# Patient Record
Sex: Female | Born: 1935 | Race: White | Hispanic: No
Health system: Southern US, Community
[De-identification: ages and names within clinical notes are randomized; demographics above are authoritative.]

---

## 2004-03-01 ENCOUNTER — Ambulatory Visit: Payer: Self-pay | Admitting: Family Medicine

## 2004-03-18 ENCOUNTER — Ambulatory Visit: Payer: Self-pay | Admitting: Family Medicine

## 2004-04-18 ENCOUNTER — Ambulatory Visit: Payer: Self-pay | Admitting: Family Medicine

## 2005-09-29 ENCOUNTER — Other Ambulatory Visit: Payer: Self-pay

## 2005-09-29 ENCOUNTER — Ambulatory Visit: Payer: Self-pay | Admitting: Urology

## 2005-10-04 ENCOUNTER — Ambulatory Visit: Payer: Self-pay | Admitting: Urology

## 2007-09-11 ENCOUNTER — Ambulatory Visit: Payer: Self-pay | Admitting: Family Medicine

## 2008-01-04 ENCOUNTER — Ambulatory Visit: Payer: Self-pay | Admitting: Neurology

## 2008-09-12 ENCOUNTER — Ambulatory Visit: Payer: Self-pay | Admitting: Family Medicine

## 2010-12-30 ENCOUNTER — Ambulatory Visit: Payer: Self-pay | Admitting: Family Medicine

## 2011-05-27 ENCOUNTER — Ambulatory Visit: Payer: Self-pay | Admitting: Rheumatology

## 2011-10-19 ENCOUNTER — Ambulatory Visit: Payer: Self-pay | Admitting: Physical Medicine and Rehabilitation

## 2011-12-26 ENCOUNTER — Ambulatory Visit: Payer: Self-pay | Admitting: Urology

## 2012-01-31 ENCOUNTER — Ambulatory Visit: Payer: Self-pay | Admitting: Family Medicine

## 2012-03-22 ENCOUNTER — Emergency Department: Payer: Self-pay | Admitting: Emergency Medicine

## 2012-03-22 LAB — CK TOTAL AND CKMB (NOT AT ARMC)
CK, Total: 39 U/L (ref 21–215)
CK-MB: <0.5 ng/mL — ABNORMAL LOW (ref 0.5–3.6)

## 2012-03-22 LAB — COMPREHENSIVE METABOLIC PANEL
Albumin: 3.4 g/dL (ref 3.4–5.0)
Alkaline Phosphatase: 99 U/L (ref 50–136)
Anion Gap: 6 — ABNORMAL LOW (ref 7–16)
BUN: 21 mg/dL — ABNORMAL HIGH (ref 7–18)
Bilirubin,Total: 0.5 mg/dL (ref 0.2–1.0)
Calcium, Total: 8.8 mg/dL (ref 8.5–10.1)
EGFR (Non-African Amer.): 54 — ABNORMAL LOW
Glucose: 127 mg/dL — ABNORMAL HIGH (ref 65–99)
SGOT(AST): 25 U/L (ref 15–37)
Sodium: 134 mmol/L — ABNORMAL LOW (ref 136–145)

## 2012-03-22 LAB — CBC
HGB: 11.6 g/dL — ABNORMAL LOW (ref 12.0–16.0)
MCV: 91 fL (ref 80–100)
RBC: 3.87 10*6/uL (ref 3.80–5.20)
RDW: 15.9 % — ABNORMAL HIGH (ref 11.5–14.5)
WBC: 11.3 10*3/uL — ABNORMAL HIGH (ref 3.6–11.0)

## 2012-03-22 LAB — URINALYSIS, COMPLETE
Glucose,UR: NEGATIVE mg/dL (ref 0–75)
Hyaline Cast: 6
Nitrite: NEGATIVE
Protein: 30
Specific Gravity: 1.016 (ref 1.003–1.030)
WBC UR: 1351 /HPF (ref 0–5)

## 2012-03-22 LAB — TROPONIN I: Troponin-I: <0.02 ng/mL

## 2012-03-25 LAB — URINE CULTURE

## 2013-01-03 LAB — URINALYSIS, COMPLETE
Blood: NEGATIVE
Glucose,UR: NEGATIVE mg/dL (ref 0–75)
Ketone: NEGATIVE
Protein: 30
RBC,UR: 12 /HPF (ref 0–5)
Specific Gravity: 1.012 (ref 1.003–1.030)
Squamous Epithelial: 4

## 2013-01-03 LAB — CBC
HCT: 34 % — ABNORMAL LOW (ref 35.0–47.0)
HGB: 11.1 g/dL — ABNORMAL LOW (ref 12.0–16.0)
MCV: 84 fL (ref 80–100)
WBC: 13.7 10*3/uL — ABNORMAL HIGH (ref 3.6–11.0)

## 2013-01-03 LAB — COMPREHENSIVE METABOLIC PANEL
Albumin: 2.6 g/dL — ABNORMAL LOW (ref 3.4–5.0)
Anion Gap: 7 (ref 7–16)
BUN: 23 mg/dL — ABNORMAL HIGH (ref 7–18)
Bilirubin,Total: 0.3 mg/dL (ref 0.2–1.0)
Chloride: 103 mmol/L (ref 98–107)
Co2: 26 mmol/L (ref 21–32)
Creatinine: 0.93 mg/dL (ref 0.60–1.30)
EGFR (African American): >60
EGFR (Non-African Amer.): 56 — ABNORMAL LOW
Glucose: 135 mg/dL — ABNORMAL HIGH (ref 65–99)
Osmolality: 278 (ref 275–301)
Potassium: 3.6 mmol/L (ref 3.5–5.1)
SGOT(AST): 26 U/L (ref 15–37)
SGPT (ALT): 35 U/L (ref 12–78)
Sodium: 136 mmol/L (ref 136–145)

## 2013-01-03 LAB — LIPASE, BLOOD: Lipase: 100 U/L (ref 73–393)

## 2013-01-04 ENCOUNTER — Inpatient Hospital Stay: Payer: Self-pay | Admitting: Family Medicine

## 2013-01-04 LAB — CREATININE, SERUM: EGFR (Non-African Amer.): 56 — ABNORMAL LOW

## 2013-01-04 LAB — BUN: BUN: 13 mg/dL (ref 7–18)

## 2013-01-05 LAB — BASIC METABOLIC PANEL
Anion Gap: 7 (ref 7–16)
Chloride: 110 mmol/L — ABNORMAL HIGH (ref 98–107)
Co2: 24 mmol/L (ref 21–32)
Creatinine: 0.86 mg/dL (ref 0.60–1.30)
EGFR (African American): >60
EGFR (Non-African Amer.): >60
Glucose: 102 mg/dL — ABNORMAL HIGH (ref 65–99)
Osmolality: 281 (ref 275–301)
Sodium: 141 mmol/L (ref 136–145)

## 2013-01-05 LAB — LIPID PANEL
HDL Cholesterol: 30 mg/dL — ABNORMAL LOW (ref 40–60)
Ldl Cholesterol, Calc: 56 mg/dL (ref 0–100)
Triglycerides: 88 mg/dL (ref 0–200)
VLDL Cholesterol, Calc: 18 mg/dL (ref 5–40)

## 2013-01-05 LAB — CBC WITH DIFFERENTIAL/PLATELET
Basophil #: 0.2 10*3/uL — ABNORMAL HIGH (ref 0.0–0.1)
Basophil %: 1.8 %
HGB: 9.7 g/dL — ABNORMAL LOW (ref 12.0–16.0)
Lymphocyte #: 1.9 10*3/uL (ref 1.0–3.6)
Lymphocyte %: 21.4 %
MCH: 28.2 pg (ref 26.0–34.0)
MCV: 86 fL (ref 80–100)
Monocyte #: 0.1 x10 3/mm — ABNORMAL LOW (ref 0.2–0.9)
Monocyte %: 0.8 %
Neutrophil #: 6.7 10*3/uL — ABNORMAL HIGH (ref 1.4–6.5)
Platelet: 410 10*3/uL (ref 150–440)
RDW: 16.5 % — ABNORMAL HIGH (ref 11.5–14.5)
WBC: 8.9 10*3/uL (ref 3.6–11.0)

## 2013-01-05 LAB — TSH: Thyroid Stimulating Horm: 2.42 u[IU]/mL

## 2013-01-05 LAB — HEMOGLOBIN A1C: Hemoglobin A1C: 6 % (ref 4.2–6.3)

## 2013-01-06 LAB — CBC WITH DIFFERENTIAL/PLATELET
Basophil #: 0.2 10*3/uL — ABNORMAL HIGH (ref 0.0–0.1)
Eosinophil %: 1 %
HCT: 31.9 % — ABNORMAL LOW (ref 35.0–47.0)
Lymphocyte #: 1.7 10*3/uL (ref 1.0–3.6)
Lymphocyte %: 20.7 %
MCHC: 32.8 g/dL (ref 32.0–36.0)
MCV: 85 fL (ref 80–100)
Neutrophil #: 6.3 10*3/uL (ref 1.4–6.5)
Neutrophil %: 75.3 %
Platelet: 378 10*3/uL (ref 150–440)
RBC: 3.77 10*6/uL — ABNORMAL LOW (ref 3.80–5.20)
RDW: 16.9 % — ABNORMAL HIGH (ref 11.5–14.5)
WBC: 8.4 10*3/uL (ref 3.6–11.0)

## 2013-01-06 LAB — BASIC METABOLIC PANEL
Anion Gap: 7 (ref 7–16)
Calcium, Total: 8.6 mg/dL (ref 8.5–10.1)
Co2: 24 mmol/L (ref 21–32)
Creatinine: 0.86 mg/dL (ref 0.60–1.30)
EGFR (Non-African Amer.): >60
Glucose: 105 mg/dL — ABNORMAL HIGH (ref 65–99)
Potassium: 4 mmol/L (ref 3.5–5.1)
Sodium: 140 mmol/L (ref 136–145)

## 2013-01-07 LAB — BASIC METABOLIC PANEL
Anion Gap: 7 (ref 7–16)
BUN: 6 mg/dL — ABNORMAL LOW (ref 7–18)
Calcium, Total: 8.5 mg/dL (ref 8.5–10.1)
Co2: 24 mmol/L (ref 21–32)
Creatinine: 0.82 mg/dL (ref 0.60–1.30)
EGFR (African American): >60
EGFR (Non-African Amer.): >60
Glucose: 96 mg/dL (ref 65–99)
Osmolality: 275 (ref 275–301)
Potassium: 3.8 mmol/L (ref 3.5–5.1)
Sodium: 139 mmol/L (ref 136–145)

## 2013-01-08 LAB — CULTURE, BLOOD (SINGLE)

## 2013-01-13 ENCOUNTER — Emergency Department: Payer: Self-pay | Admitting: Emergency Medicine

## 2013-01-13 LAB — BASIC METABOLIC PANEL
Anion Gap: 5 — ABNORMAL LOW (ref 7–16)
Co2: 25 mmol/L (ref 21–32)
EGFR (African American): 54 — ABNORMAL LOW
Glucose: 129 mg/dL — ABNORMAL HIGH (ref 65–99)
Osmolality: 271 (ref 275–301)
Potassium: 4.5 mmol/L (ref 3.5–5.1)
Sodium: 133 mmol/L — ABNORMAL LOW (ref 136–145)

## 2013-01-13 LAB — CBC
HCT: 32.9 % — ABNORMAL LOW (ref 35.0–47.0)
Platelet: 620 10*3/uL — ABNORMAL HIGH (ref 150–440)
RBC: 3.91 10*6/uL (ref 3.80–5.20)
RDW: 16.7 % — ABNORMAL HIGH (ref 11.5–14.5)
WBC: 16.1 10*3/uL — ABNORMAL HIGH (ref 3.6–11.0)

## 2013-03-04 ENCOUNTER — Ambulatory Visit: Payer: Self-pay | Admitting: Otolaryngology

## 2013-03-05 LAB — PATHOLOGY REPORT

## 2013-03-25 ENCOUNTER — Ambulatory Visit: Payer: Self-pay | Admitting: Otolaryngology

## 2013-03-25 LAB — CULTURE, FUNGUS WITHOUT SMEAR

## 2013-04-03 ENCOUNTER — Ambulatory Visit: Payer: Self-pay | Admitting: Otolaryngology

## 2013-04-05 LAB — PATHOLOGY REPORT

## 2013-05-08 ENCOUNTER — Ambulatory Visit: Payer: Self-pay | Admitting: Urology

## 2013-07-03 ENCOUNTER — Emergency Department: Payer: Self-pay | Admitting: Emergency Medicine

## 2013-07-03 LAB — URINALYSIS, COMPLETE
BLOOD: NEGATIVE
Bacteria: NONE SEEN
Bilirubin,UR: NEGATIVE
Glucose,UR: NEGATIVE mg/dL (ref 0–75)
KETONE: NEGATIVE
Nitrite: NEGATIVE
PH: 7 (ref 4.5–8.0)
Protein: NEGATIVE
Specific Gravity: 1.016 (ref 1.003–1.030)
WBC UR: 66 /HPF (ref 0–5)

## 2013-07-03 LAB — COMPREHENSIVE METABOLIC PANEL
ANION GAP: 2 — AB (ref 7–16)
Albumin: 3 g/dL — ABNORMAL LOW (ref 3.4–5.0)
Alkaline Phosphatase: 65 U/L
BUN: 24 mg/dL — ABNORMAL HIGH (ref 7–18)
Bilirubin,Total: 0.3 mg/dL (ref 0.2–1.0)
CO2: 30 mmol/L (ref 21–32)
CREATININE: 0.88 mg/dL (ref 0.60–1.30)
Calcium, Total: 9 mg/dL (ref 8.5–10.1)
Chloride: 104 mmol/L (ref 98–107)
EGFR (Non-African Amer.): 60 — ABNORMAL LOW
Glucose: 78 mg/dL (ref 65–99)
Osmolality: 275 (ref 275–301)
POTASSIUM: 4.6 mmol/L (ref 3.5–5.1)
SGOT(AST): 22 U/L (ref 15–37)
SGPT (ALT): 16 U/L (ref 12–78)
Sodium: 136 mmol/L (ref 136–145)
Total Protein: 6.7 g/dL (ref 6.4–8.2)

## 2013-07-03 LAB — CBC WITH DIFFERENTIAL/PLATELET
Basophil #: 0.1 10*3/uL (ref 0.0–0.1)
Basophil %: 1 %
Eosinophil #: 0 10*3/uL (ref 0.0–0.7)
Eosinophil %: 0.3 %
HCT: 32.7 % — ABNORMAL LOW (ref 35.0–47.0)
HGB: 10.6 g/dL — ABNORMAL LOW (ref 12.0–16.0)
Lymphocyte #: 1.3 10*3/uL (ref 1.0–3.6)
Lymphocyte %: 12 %
MCH: 28.3 pg (ref 26.0–34.0)
MCHC: 32.5 g/dL (ref 32.0–36.0)
MCV: 87 fL (ref 80–100)
Monocyte #: 0.1 x10 3/mm — ABNORMAL LOW (ref 0.2–0.9)
Monocyte %: 1 %
Neutrophil #: 9.2 10*3/uL — ABNORMAL HIGH (ref 1.4–6.5)
Neutrophil %: 85.7 %
Platelet: 257 10*3/uL (ref 150–440)
RBC: 3.75 10*6/uL — AB (ref 3.80–5.20)
RDW: 18.8 % — AB (ref 11.5–14.5)
WBC: 10.7 10*3/uL (ref 3.6–11.0)

## 2013-07-06 LAB — CBC
HCT: 33 % — ABNORMAL LOW (ref 35.0–47.0)
HGB: 10.6 g/dL — ABNORMAL LOW (ref 12.0–16.0)
MCH: 27.7 pg (ref 26.0–34.0)
MCHC: 32.2 g/dL (ref 32.0–36.0)
MCV: 86 fL (ref 80–100)
Platelet: 391 10*3/uL (ref 150–440)
RBC: 3.83 10*6/uL (ref 3.80–5.20)
RDW: 18.2 % — ABNORMAL HIGH (ref 11.5–14.5)
WBC: 12.5 10*3/uL — AB (ref 3.6–11.0)

## 2013-07-06 LAB — URINALYSIS, COMPLETE
BILIRUBIN, UR: NEGATIVE
Bacteria: NONE SEEN
Blood: NEGATIVE
Glucose,UR: NEGATIVE mg/dL (ref 0–75)
KETONE: NEGATIVE
LEUKOCYTE ESTERASE: NEGATIVE
NITRITE: NEGATIVE
PROTEIN: NEGATIVE
Ph: 7 (ref 4.5–8.0)
Specific Gravity: 1.015 (ref 1.003–1.030)

## 2013-07-06 LAB — COMPREHENSIVE METABOLIC PANEL
ALK PHOS: 68 U/L
ANION GAP: 4 — AB (ref 7–16)
Albumin: 3 g/dL — ABNORMAL LOW (ref 3.4–5.0)
BILIRUBIN TOTAL: 0.3 mg/dL (ref 0.2–1.0)
BUN: 18 mg/dL (ref 7–18)
CHLORIDE: 103 mmol/L (ref 98–107)
CO2: 29 mmol/L (ref 21–32)
Calcium, Total: 9.1 mg/dL (ref 8.5–10.1)
Creatinine: 0.91 mg/dL (ref 0.60–1.30)
EGFR (African American): >60
GFR CALC NON AF AMER: 58 — AB
Glucose: 107 mg/dL — ABNORMAL HIGH (ref 65–99)
Osmolality: 274 (ref 275–301)
Potassium: 4.1 mmol/L (ref 3.5–5.1)
SGOT(AST): 23 U/L (ref 15–37)
SGPT (ALT): 17 U/L (ref 12–78)
Sodium: 136 mmol/L (ref 136–145)
Total Protein: 6.9 g/dL (ref 6.4–8.2)

## 2013-07-07 ENCOUNTER — Observation Stay: Payer: Self-pay | Admitting: Internal Medicine

## 2013-07-08 ENCOUNTER — Ambulatory Visit: Payer: Self-pay | Admitting: Internal Medicine

## 2013-07-08 LAB — BASIC METABOLIC PANEL
ANION GAP: 3 — AB (ref 7–16)
BUN: 10 mg/dL (ref 7–18)
Calcium, Total: 8.2 mg/dL — ABNORMAL LOW (ref 8.5–10.1)
Chloride: 106 mmol/L (ref 98–107)
Co2: 28 mmol/L (ref 21–32)
Creatinine: 0.7 mg/dL (ref 0.60–1.30)
EGFR (Non-African Amer.): >60
Glucose: 79 mg/dL (ref 65–99)
OSMOLALITY: 272 (ref 275–301)
POTASSIUM: 3.4 mmol/L — AB (ref 3.5–5.1)
Sodium: 137 mmol/L (ref 136–145)

## 2013-07-08 LAB — CBC WITH DIFFERENTIAL/PLATELET
Basophil #: 0.1 10*3/uL (ref 0.0–0.1)
Basophil %: 1.2 %
EOS ABS: 0 10*3/uL (ref 0.0–0.7)
EOS PCT: 0.3 %
HCT: 29.4 % — ABNORMAL LOW (ref 35.0–47.0)
HGB: 9.6 g/dL — AB (ref 12.0–16.0)
Lymphocyte #: 1.3 10*3/uL (ref 1.0–3.6)
Lymphocyte %: 16.4 %
MCH: 28.2 pg (ref 26.0–34.0)
MCHC: 32.7 g/dL (ref 32.0–36.0)
MCV: 86 fL (ref 80–100)
MONO ABS: 0 x10 3/mm — AB (ref 0.2–0.9)
Monocyte %: 0.6 %
NEUTROS ABS: 6.3 10*3/uL (ref 1.4–6.5)
Neutrophil %: 81.5 %
Platelet: 349 10*3/uL (ref 150–440)
RBC: 3.42 10*6/uL — AB (ref 3.80–5.20)
RDW: 17.5 % — AB (ref 11.5–14.5)
WBC: 7.8 10*3/uL (ref 3.6–11.0)

## 2013-07-08 LAB — URINE CULTURE

## 2013-07-17 ENCOUNTER — Ambulatory Visit: Payer: Self-pay | Admitting: Internal Medicine

## 2013-08-16 DEATH — deceased

## 2014-08-08 NOTE — Discharge Summary (Signed)
PATIENT NAME:  Ruth Nicholson, Ruth Nicholson MR#:  [id number] DATE OF BIRTH:  1925/08/10  DATE OF ADMISSION:  01/04/2013 DATE OF DISCHARGE:  01/07/2013  REASON FOR ADMISSION: Feeling weak, tired, with vomiting for a couple of days.   FINAL DIAGNOSES: 1. Acute pyelonephritis associated with altered mental status. Urine cultures and blood cultures were negative, but the patient was symptomatic.  2. Thickening of the bladder which could be secondary to infection versus cancer. Followup CT necessary.  3. Failure to thrive. 4. Weight loss of 40 pounds.  5. Necrotic nodule on the left upper neck.  6. Hypertension.  7. Diabetes.  8. History thrombophlebitis.  9. Rheumatoid arthritis.  10. Severe depression.  11. Hypoalbuminemia.  12. Severe malnutrition.  13. Systemic inflammatory response syndrome present at admission as the patient was tachycardic, had elevation of white blood cells and altered mental status.   PROCEDURES DURING THIS HOSPITALIZATION: Fiber-optic nasal laryngoscopy for evaluation of left neck mass. Surgeon was Dr. Geanie Logan.   FINDINGS: No lesions on nasopharynx or nasal cavity. Clear vocal cords. Nothing acute showing on this.  SIGNIFICANT IMPORTANT RESULTS: White count is 13.7 on admission with a hemoglobin of 11, platelet count of 485. She had a urinalysis showing 123 white blood cells, 12 red blood cells, leukocyte esterase positive, positive bacteria.   Urine culture show mixed bacteria. Blood culture was negative. Albumin was 2.6. Other LFTs within normal limits. Glucose 135, BUN 23, sodium 136, potassium 3.6. Other electrolytes were within normal limits.   DISPOSITION: Home with home health.   MEDICATIONS AT DISCHARGE: Singulair 10 mg once a day, fentanyl 50 mcg/h transdermal every 3 days, vitamin D 50,000 units once a week, Fosamax 70 mg once a week, methotrexate 2.5 mg take 6 tablets once a week, Tylenol with oxycodone every 8 hours as needed for pain.  30 mg at bedtime; this is  a new medication. Plavix 75 mg once a day, hold until 2 days after the biopsy is done. Docusate 100 mg twice daily, polyethylene glucose 17 grams as needed for constipation. Cefuroxime 500 mg twice daily for 5 days, alprazolam 0.5 mg once a day.   FOLLOWUP:  1. The patient needs a CT-guided lymph node aspiration with needle of the left neck on this coming Friday. Hold Plavix, last dose was given 01/07/2103.  2. ENT, Dr. Willeen Cass followup due to neck nodules.  3. Psychiatric: Dr. Toni Amend' orders show Kathrin Ruddy and Dr. Maryruth Bun to follow.  4. Followup with Dr. Robby Sermon, PCP.   5. Followup with Urology as the patient has thickness of the bladder seen on CT scan, and the patient also self-catheterizes or is supposed to self-catheterize due to urinary retention.   HOSPITAL COURSE: This is a very nice 81 year old female who has history of working really hard all her life. 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   Since then, she has been severely depressed with significant decline, failure to thrive, and a 40-pound weight loss.   For the past 3 days prior to being admitted, the patient started having some vomiting, decreased p.o. intake, increase in her low back pain with intermittent changes in her mental status.   She had some generalized abdominal pain associated with the nausea and vomiting, multiple episodes of vomiting clear fluid, mostly gastric contents.   The CT scan of the abdomen was done revealing some bladder distention  with cholelithiasis, but no cholecystitis.   The patient received Rocephin  due to a possible UTI. She was symptomatic with dysuria and she has had increased back pain and occasionally feelings of fever.   The patient was given IV fluids since she was severely dehydrated.   She was admitted after that.   PROBLEMS: 1. Systemic inflammatory response syndrome, likely due to symptomatic UTI. Resolved. Treated with  antibiotics.  2. Acute pyelonephritis. Diagnosis is based on symptoms including fever at home, changes in mental status, increased white blood cells on urinalysis, and some thickness of the bladder on the CT scan could be secondary to cystitis versus other problems including cancer. The patient could have chronic pyuria. The urinalysis did not grow any pathogens, although based on symptoms we decided to treat empirically and for a short period of time. The patient improved after IV fluids and antibiotics.  3. Failure to thrive. The patient had a 40-pound weight loss, which we believe is secondary to her severe depression; and the first couple of days that I saw the patient she was completely withdrawn. She was very sad, easy to upset. She was crying a lot. She was started on Remeron. We had long conversations about her care and how to make it better; and the following day, actually, she is starting to feel better, and by the time of discharge she actually was smiling a lot. We got Dr. Toni Amendlapacs to help us with this evaluation and he agreed with my assessment and continue the medication that we started, which was Remeron. The reason to use Remeron because of the lack of appetite, the severe weight loss, and also the insomnia. The patient reacted very well with this medication. Followup with Dr. Maryruth BunKapur was recommended.  4. Neck mass. The patient has a lymph node that is necrotic by CT scan. Dr. Willeen CassBennett evaluated the patient. Recommended CT-guided biopsies to get down to the core and get a good sample. The patient is going to have this procedure done this following Friday. The reason to do it then is because the patient was taking Plavix due to history of thrombophlebitis. She denies having any coronary artery disease, but she was told by her primary care physician that she would need this medication. At this moment, we are not stopping it, but we are holding it for the procedure.  5. Hypertension. Her blood pressure  remained stable. No change in her medications for now.  6. Diabetes. The patient was just observed on insulin sliding scale. Her blood sugars were overall acceptable. The patient also has history of rheumatoid arthritis, for which she takes methotrexate. There were no significant exacerbations of this problem during this hospitalization.  7. As far as her thickness of the bladder, we refer [facility] to be re-evaluated after the infection is treated. If the thickness persists, recommended to do a cystoscopy.   Overall, the patient did well during this hospitalization. I was happy to see her getting better and smiling by discharge. The patient has been released under the care of her family.   TIME SPENT: I spent about 45 minutes with this discharge on the date of discharge.   ____________________________ Felipa Furnaceoberto Sanchez Gutierrez, MD rsg:sg D: 01/08/2013 07:11:00 ET T: 01/08/2013 07:32:48 ET JOB#: [id number]  cc: Ollen GrossPaul S. Willeen CassBennett, MD Rhona LeavensJames F. Burnett ShengHedrick, MD Doralee AlbinoAarti K. Maryruth BunKapur, MD [facility] Felipa Furnaceoberto Sanchez Gutierrez, MD, <Dictator>    Kenderick Kobler Juanda ChanceSANCHEZ GUTIERRE MD ELECTRONICALLY SIGNED 01/10/2013 11:06

## 2014-08-08 NOTE — Op Note (Signed)
PATIENT NAME:  Ruth Nicholson, Ruth Nicholson MR#:  [id number] DATE OF BIRTH:  [birth year]  DATE OF PROCEDURE:  04/03/2013  PREOPERATIVE DIAGNOSIS: Left cervical lymphadenopathy.  POSTOPERATIVE DIAGNOSIS: Left cervical lymphadenopathy.   PROCEDURE: Left selective deep cervical lymph node dissection.   SURGEON: Willeen CassBennett, MD.   ANESTHESIA: General with laryngeal mask airway.   INDICATIONS: An 81 year old with a necrotic left cervical lymph node of uncertain etiology. Needle aspiration was nondiagnostic.   FINDINGS: There was a matted group of nodes in the level II/III region with some adjacent smaller nodes. The larger nodal mass appears to have begun draining from a tract through the skin and this overlying tract was excised and sent with the specimen. There was extensive inflammatory change and fibrosis in the region, and the node appeared to focally involve the internal jugular vein, which was resected with the specimen. This was a very small vein that appeared to be thrombosed and possibly invaded. A separate group III lymph node was also sent with the specimen of group II and III matted nodes.   COMPLICATIONS: None.   DESCRIPTION OF PROCEDURE: After obtaining informed consent, the patient was taken to the operating room and placed in the supine position. After induction of general anesthesia with laryngeal mask airway, the left neck was injected with 1% lidocaine with epinephrine 1:200,000.  She was then prepped and draped in the usual sterile fashion. There was a draining tract to the skin which was quite excoriated. This was excised in an ellipse and the incision carried down through the platysma excising the entire drainage tract and subcutaneous fatty tissues. The dissection was carried down to the anterior border of the sternocleidomastoid and then mass dissected away from this. There was a lot of fibrosis in this area. The mass was then carefully dissected out. The spinal accessory nerve was identified  superiorly and preserved. There was extensive fibrosis in the neck in the region of the internal jugular vein and just below the digastric muscle. Deeper dissection revealed the patient had a small likely thrombosed internal jugular vein that went through the mass of matted lymph nodes. This was resected and the upper and lower aspects of the vein were divided and tied with 2-0 silk suture. The vein was probably no more than 4 mm to 5 mm in size, and again appeared thrombosed in its section. The mass was dissected away from the digastric muscle superiorly to which there was quite a bit of fibrotic attachment. Anteriorly it freed up nicely from the strap muscles. There was no invasion deeper into the neck or the region of the carotid sheath, fortunately. The Harmonic scalpel was used for dividing all of the fibrotic attachments and smaller blood vessels. There were several small nodes adjacent to the larger nodal mass that were sent with the group II specimen. A separate level III lymph node was identified and sent as a separate specimen. The wound was irrigated and a #10 TLS drain placed. The wound is quite dry at the completion of the procedure. The sternocleidomastoid was sutured anteriorly to the platysma muscle to provide some coverage over the region of the carotid using 4-0 Vicryl suture. The subcutaneous tissues were closed with 4-0 Vicryl suture followed by 5-0 Prolene suture for the skin in a running lock stitch. The TLS drain was secured with 5-0 Prolene suture. The patient was then returned to the anesthesiologist for awakening. She was awakened and taken to the recovery room in good condition postoperatively. Blood loss was approximately 25  mL.    ____________________________ Ollen Gross. Willeen Cass, MD psb:ce D: 04/03/2013 12:51:41 ET T: 04/03/2013 17:00:24 ET JOB#: [id number]  cc: Ollen Gross. Willeen Cass, MD, <Dictator> Sandi Mealy MD ELECTRONICALLY SIGNED 04/03/2013 17:30

## 2014-08-08 NOTE — Consult Note (Signed)
PATIENT NAME:  Ruth Nicholson, Ruth Nicholson MR#:  [id number] DATE OF BIRTH:  1926/03/27  ENT SURGERY CONSULTATION REPORT  DATE OF CONSULTATION:  01/06/2013  REQUESTING PHYSICIAN:  Padmaja Vasireddy.  CONSULTING PHYSICIAN:  Ollen Grossaul S. Willeen CassBennett, MD  CONSULTING PHYSICIAN:  Marion DownerScott Kei Langhorst.   REASON FOR CONSULTATION: Left neck mass.   HISTORY OF PRESENT ILLNESS: An 81 year old female admitted to the hospital on the 19th   feeling weak and tired for the previous 5 days, with associated vomiting. She was felt to have had acute pyelonephritis. She had had decreased p.o. intake and confusion, and lost 40 pounds in the past 8 months after she lost her job.   CT scan of the abdomen in the ER showed moderate bladder distention and cholelithiasis. She was admitted for treatment.   She has a history of thrombophlebitis and is on Plavix for this. She has a 7741-month history of a left neck mass. It is nontender, and she denies any difficulty swallowing, sore throat, or hoarseness with this. She is a nonsmoker, although she had some second-hand smoke due to her husband smoking 3 packs of cigarettes a day in the past. She denies any tenderness with the mass and says her primary care physician put her on a round of antibiotics, which did not improve the size of the mass at all.   CT scan of the neck confirmed a necrotic mass consistent with the lymph node with a necrotic center.   PAST MEDICAL HISTORY: Diabetes mellitus, hypertension, spinal disk disease, history of allergies, history of thrombophlebitis.   PAST SURGICAL HISTORY: Hysterectomy for uterine cancer in 1977, rotator cuff surgery, back surgery.   MEDICATIONS: Include Percocet 1 to 2 every 4 to 6 hours as needed for pain, Fosamax 70 mg p.o. weekly, alprazolam 0.25 mg p.o. q.8 h., p.r.n. anxiety, Plavix 75 mg p.o. daily, Colace  100 mg p.o. b.i.d., duloxetine 30 mg p.o. daily, Lovenox 40 mg subcutaneously daily, Duragesic patch 50 mcg topically q.3 days, sliding-scale  insulin,  Lidoderm 5% patch, apply to affected area daily, Remeron 30 mg p.o. at bedtime, Singulair 10 mg p.o. daily, morphine 2 mg IV push q. 4 hours p.r.n. pain, Zofran 4 mg IV push q. 6 hours p.r.n. nausea and vomiting, Rocephin  1 gram IV q.24 hours, pantoprazole 40 mg p.o. in the morning, MiraLAX Powder 75 mg orally daily p.r.n. constipation, vitamin D2 50,000 units p.o. weekly, methotrexate 15 mg p.o. weekly.   ALLERGIES: SULFA.   SOCIAL HISTORY: She lives alone. There is no history of smoking, alcohol, or drug use.   FAMILY HISTORY: Father died with a heart attack at a young age.   REVIEW OF SYSTEMS: No fevers or chills, or difficulty breathing. She has in the past had exposures to a cat, who died 3 months ago. She says she was occasionally bitten or scratched by the cat. There is no history of tuberculosis. She has had some depression which apparently is improved since she has  been in the hospital.   PHYSICAL EXAMINATION: VITAL SIGNS: Temperature is 98.4, pulse 82, blood pressure is 146/66.  GENERAL EXAM: A thin, elderly female in no acute distress. There is no evidence of altered mental status at this point. She is conversant and able to answer questions appropriately.  HEAD AND FACE: Head is normocephalic, atraumatic. No facial skin lesions. Facial strength is normal and symmetric.  EARS: External ears, ear canals, tympanic membranes are clear bilaterally, with no middle ear effusion or infection.  NOSE: External nose unremarkable.  Nasal cavity is clear. No purulence or polyps are seen. Septum is relatively straight.  ORAL CAVITY AND OROPHARYNX: Teeth, lips, and gums unremarkable. She wears an upper denture and is partially edentulous below. Tongue and floor of mouth without lesions. There are no buccal mucosal lesions. The palate and tonsillar fossae are unremarkable, with no mass, lesions, or leukoplakia or erythema. Indirect laryngoscopy is not possible in this setting.  NECK: The  neck is supple, with a 2 cm mid-jugular lymph node palpable. It feels separate from the parotid. It is firm and slightly fixed. No other adenopathy is palpable. There is no thyromegaly. The adjacent salivary glands are soft and nontender, without masses   Fiberoptic nasal laryngoscopy was performed. The hypopharynx, larynx and tongue base were all carefully examined. There was no evidence of any mucosal lesion that might explain this cervical lymph node. The patient tolerated the procedure well.   DATA REVIEW: I did review her CT scan, and there is a cystic neck mass below the parotid on the left side. Fat planes around the carotid region are somewhat indistinct along the most superior-most aspect of this mass, so infiltration around the carotid cannot be precluded.   ASSESSMENT: This is a patient with a left cervical lymph node which is concerning for a malignancy and feels fixed. While it is near the inferior aspect of the parotid, it does seem to be separated by clinical exam. She has no history of smoking.   PLAN: She will need needle aspiration through radiology, ideally obtaining a core biopsy to maximize tissue since this node may not be easily resectable in the case of open biopsy because of poor planes around the carotid.   Given the fact she is a nonsmoker, cultures might be sent from the lymph node to rule out atypical tuberculosis or cat-scratch, but this seems less likely. She will need to be off of her Plavix in preparation for needle aspiration and radiology, so this may need to be set up as an outpatient if she is going to be discharged in the near future. We would not likely not want to perform a core biopsy with her on Plavix, and she would need to be off of it for at least 5 days.    ____________________________ Ollen Gross. Willeen Cass, MD psb:dm D: 01/06/2013 13:31:48 ET T: 01/06/2013 15:15:17 ET JOB#: [id number]  cc: Ollen Gross. Willeen Cass, MD, <Dictator> Sandi Mealy MD ELECTRONICALLY  SIGNED 01/09/2013 8:48

## 2014-08-08 NOTE — Consult Note (Signed)
Brief Consult Note: Diagnosis: Maj. depressive episode.   Patient was seen by consultant.   Comments: Psychiatry: Patient seen for followup. 81 year old woman with major depression. Today she reports she is feeling better. Patient is smiling. Affect is more upbeat. She is optimistic about going home. Tolerating medication well. Orders have been done to make sure she has followup appointments prior to discharge.  Electronic Signatures: Ananias Kolander, Jackquline DenmarkJohn T (MD)  (Signed 22-Sep-14 21:45)  Authored: Brief Consult Note   Last Updated: 22-Sep-14 21:45 by Audery Amellapacs, Taela Charbonneau T (MD)

## 2014-08-08 NOTE — H&P (Signed)
PATIENT NAME:  Ruth Nicholson, Ruth Nicholson MR#:  [id number] DATE OF BIRTH:  1925-07-17  DATE OF ADMISSION:  01/04/2013  PRIMARY CARE PHYSICIAN: Rhona Leavens. Burnett Sheng, MD  REFERRING PHYSICIAN: Enedina Finner. Manson Passey, MD  CHIEF COMPLAINT: Feeling weak and tired for the past 5 days and vomiting for the past 2 days.   HISTORY OF PRESENT ILLNESS: The patient is an 81 year old Caucasian female with past medical history of hypertension, diabetes mellitus, thrombophlebitis, who is presenting to the ER with a chief complaint of 5-day history of not feeling well and 2-day history of vomiting, associated with decreased p.o. intake for the past 3 days. The patient is also complaining of low back pain for the past few days. Daughter is reporting that her mental status is not at her baseline, and intermittently, the patient is getting confused. Her daughter also has reported that the patient has lost 40 pounds of weight in the past 8 months after she lost her job. As the patient is complaining of generalized abdominal pain radiating to the back associated with nausea and vomiting, CAT scan of the abdomen was done in the ER, which has revealed moderate bladder distention and cholelithiasis. The patient has received IV Rocephin, and hospitalist team is called to admit the patient. During my examination, the patient is moaning and not answering most of the questions. I have obtained a history from the patient's daughter, who is at bedside.   PAST MEDICAL HISTORY:  1. Diabetes mellitus.  2. Hypertension, currently not on any medication. 3. Chronic disk problem.  4. History of allergies. 5. Thrombophlebitis, taking blood thinners for thrombophlebitis.   PAST SURGICAL HISTORY:  1. Hysterectomy for uterine cancer in 1977.  2. Rotator cuff surgery.  3. Back surgery.  ALLERGIES: THE PATIENT IS ALLERGIC TO SULFA.   PSYCHOSOCIAL HISTORY: Lives alone, but daughters come and stay with her and take care of her very often. According to the  daughter, no history of smoking, alcohol or illicit drug usage.   FAMILY HISTORY: The patient's dad deceased with heart attack at young age.   REVIEW OF SYSTEMS: Unobtainable as the patient is with altered mental status.   PHYSICAL EXAMINATION:  VITAL SIGNS: Temperature is 97 degrees Fahrenheit, pulse 80, respirations 18, blood pressure 144/55, pulse oximetry 94%.  GENERAL APPEARANCE: Not under acute distress. Moderately built and thin-looking female.  HEENT: Normocephalic, atraumatic. Pupils are equally reacting to light and accommodation. No scleral icterus. No conjunctival injection. No sinus tenderness. Dry mucous membranes.  NECK: Supple. No JVD.  LUNGS: Clear to auscultation bilaterally. No accessory muscle usage. No anterior chest wall tenderness on palpation.  HEART: S1 and S2 normal. Regular rate and rhythm. No murmurs.  GASTROINTESTINAL: Soft. Bowel sounds are positive in all 4 quadrants. Minimal discomfort is present. No masses felt.  BACK: Positive CVA tenderness.  NEUROLOGIC: Lethargic, but arousable. Reflexes are 2+.  EXTREMITIES: No edema. No cyanosis. No clubbing.  SKIN: Warm to touch. Dry in nature. No rashes. No lesions.  MUSCULOSKELETAL: No joint effusion, tenderness, erythema.  PSYCHIATRIC: Mood and affect could not be examined as the patient is lethargic.   LABORATORY AND IMAGING STUDIES: CAT scan of the abdomen and pelvis has revealed new small to moderate pericardial effusion. Slightly nodular thickening along the left lateral bladder wall is nonspecific; however, could represent inflammatory tumor. Moderate bladder distention. Cholelithiasis. Nonobstructing calculus in the superior pole of the left kidney. Diverticulosis without diverticulitis. Glucose 135, BUN 23; otherwise, the BMP is normal except for GFR which is  at 956. LFTs are normal except for albumin which is low at 2.2. WBC 13.7, hemoglobin 11.1, hematocrit 34.0, platelets 485. Urinalysis: Yellow in color, cloudy  in appearance, nitrite negative, leukocyte esterase 3+.   ASSESSMENT AND PLAN: An 81 year old Caucasian female brought to the ER for nausea, vomiting, back pain and decreased p.o. intake. Will be admitted with the following assessment and plan.   1. Acute pyelonephritis associated with altered mental status. Blood cultures and urine cultures were obtained. Will provide gentle hydration with IV fluids. The patient will be on clear liquid diet.  IV Rocephin.  IV Protonix.  Morphine as-needed basis for pain.  2. Failure to thrive with 40-pound weight loss in 8 months. Could be from depression, but will consider ruling out medical etiologies first. Will check thyroid stimulating hormone. Dietitian consult is placed regarding calorie count. Further workup by rounding physician as needed.  3. Hypertension. Blood pressure is on the low-normal side. Will consider close monitoring. The patient will be on IV fluids.  4. Diabetes mellitus. The patient will be on insulin sliding scale.  5. Thrombophlebitis. Resume Plavix, which is her home medication.  6. Will provide gastrointestinal and deep vein thrombosis prophylaxis.   CODE STATUS: The patient is DNR, as per my discussion with daughter, who is also medical power of attorney, at bedside. The patient's registered nurse, Kirt Boysshley Miller, was present during the entire discussion with the patient's daughter.   Diagnosis and plan of care were discussed in detail with the patient's daughter at bedside. She verbalized understanding of the plan.   TOTAL TIME SPENT ON ADMISSION: 50 minutes.   ____________________________ Ramonita LabAruna Keilon Ressel, MD ag:OSi D: 01/04/2013 04:12:22 ET T: 01/04/2013 05:48:44 ET JOB#: [id number]  cc: Ramonita LabAruna Shanelle Clontz, MD, <Dictator> Rhona LeavensJames F. Burnett ShengHedrick, MD Ramonita LabARUNA Tayden Duran MD ELECTRONICALLY SIGNED 01/05/2013 7:28

## 2014-08-08 NOTE — Consult Note (Signed)
Patient seen and examined. Notes dictated. Node is concerning for malignancy, but no primary seen on exam. I would recommend CT guided CORE biopsy (not FNA), to obtain diagnosis. Excisional biopsy would be a last resort, as there may be carotid involvement on the scan. Cultures might also be sent from the node to rule out atypical TB and Cat scratch, given the fact patient is a non smoker. patient will need to be off Plavix for a few days for Radiology to perform the biopsy, so you will need to schedule a date with Radiology, and plan her discontinuation of Plavix around that. I am happy to see her back in the office after to discuss the results and further recommendations.  Electronic Signatures: Sandi MealyBennett, Morgaine Kimball S (MD)  (Signed on 21-Sep-14 13:39)  Authored  Last Updated: 21-Sep-14 13:39 by Sandi MealyBennett, Braven Wolk S (MD)

## 2014-08-08 NOTE — Consult Note (Signed)
PATIENT NAME:  Ruth Nicholson, Ruth Nicholson MR#:  [id number] DATE OF BIRTH:  January 30, 1926  DATE OF CONSULTATION:  01/05/2013  CONSULTING PHYSICIAN:  Audery Amel, MD  IDENTIFYING INFORMATION AND REASON FOR CONSULTATION: An 81 year old woman currently in the hospital with a bad urinary tract infection, also with general malaise and failure to thrive. Consultation because of concern about depression symptoms.   HISTORY OF PRESENT ILLNESS: Information obtained from direct interview with the patient, also review of her chart, also spoke with the patient's daughter and with her outpatient pharmacy, [facility]. The patient was admitted to the hospital with nausea, vomiting, weakness, malaise and significant weight loss. She has a history of diabetes, hypertension and chronic pain. She has been found to have a urinary tract infection. The patient tells me that she has been feeling sad and depressed recently. She cannot put her finger on the exact time course of it, but it has been getting worse over the last few months. A couple of major events that she associates with it are that her middle daughter, with whom she is the closest, has had cancer, and although the treatment seems to be effective, the patient remains very upset and sad about it. Also, apparently she had been seeing a man until fairly recently, but has not heard from him in a while, and that has caused her to feel more lonely and sad than she had previously. She says that she has not been interested in eating and has not felt hungry for weeks or months. Her sleep is only tolerable with the medication that she takes. She has lost interest in usual activities that she would do around the home. She has been conscious of feeling more lonely and isolated. She admits that she has had thoughts cross her mind at times about wishing that she would die, but has not had any intention of trying to kill herself. She does not report any hallucinations or delusions. The patient  cannot remember specifics about antidepressant medicines that she has been prescribed. The daughter remembers that the patient had been prescribed Effexor in the past. I checked with the pharmacy, and it looks like the Effexor was last picked up in March of this year, and prior to that, she had taken Cymbalta, but that was last picked up in February of this year. It is possible that she may have gotten mail-order medicine since then, but the patient is not sure that she was taking any of them correctly. She had also been prescribed lorazepam on a p.r.n. basis.   PAST PSYCHIATRIC HISTORY: No previous hospitalizations. No history of suicide attempts. She has been treated for depressive symptoms. She has been seeing a therapist locally, Montel Clock, and has found that to be extremely helpful. Her primary care doctor had recently referred her to see Dr. Maryruth Bun for psychiatric evaluation; however, the patient had to cancel her appointment because of her medical illness. She has been treated with Effexor and Cymbalta in the past. It is not clear what the full doses were or for how long. The patient does not remember if they were effective. She has been on multiple medications for her other illnesses, but is not aware that they have caused her any side effects to make her depression worse. No history of psychotic symptoms. No history of suicide attempts.   SOCIAL HISTORY: The patient has been married and widowed twice. She has 3 adult children, all of whom live in [place]. The middle daughter is the person  she is closest to. She has good relationships it sounds like with all 3 of her children. She feels like she no longer has any friends and feels isolated in her home, but has lived there for 50 years. She is hesitant to leave it even though she understands that she might function better in assisted living. The patient discussed with me the fact that her first husband had been an angry man at times and that  they had had trouble getting along, but that later, after he passed away, she discovered that he had had terrible trauma during the war. She now feels guilty about that whole relationship.   FAMILY HISTORY: Does not know of any family history of mental illness.   MEDICAL HISTORY: The patient has diabetes, has a history of hypertension and has a history of chronic pain and a history of blood clots. Has had a hysterectomy for uterine cancer in the past and several other surgeries.   SUBSTANCE ABUSE HISTORY: Denies any history of drinking or substance abuse problems.   REVIEW OF SYSTEMS: Tired, nauseous and weak. Mood is depressed. Frequent tearfulness. Passive suicidal thoughts, only occasional, with no intent or plan. No psychotic symptoms.   MENTAL STATUS EXAMINATION: An elderly woman interviewed in her hospital room. She was in physical discomfort, but was still cooperative with talking with me. Speech was quiet but easy to understand. Slow. Thinking is slow but appears to be lucid. There is no sign of her being delusional. I did not do full cognitive testing on her, but just from our conversation, she did not impress me as having serious dementia. Her affect is tearful and sad and blunted at times. Mood is stated as being feeling not good. She denies any actual suicide intent or plan or wish now, but has had the thought at times. Denies homicidal ideation. Judgment and insight are good. She is able to make reasonable decisions about her care. Appears to be of normal intelligence.   LABORATORY RESULTS: Admission labs included a chemistry panel with a slightly elevated glucose. Albumin low at 2.6. Lipase normal. CBC: Elevated white count at 13.7, hemoglobin low at 11.1, platelet count elevated. Urinalysis positive for many white blood cells, leukocyte esterase and bacteria. Urine culture so far has not revealed a specific organism.   ASSESSMENT: An 81 year old woman with multiple medical problems, but  who also has multiple symptoms consistent with depression. She does not appear to have a serious degree of dementia and does appear able to make reasonable decisions about her care. She does not appear to be at an immediate risk of harm to herself, but that could become an issue in the future if the depression got worse. The patient would perhaps function better in an assisted living facility, although she is hesitant to make that decision. As far as her antidepressants, I suspect she may not have been taking any recently, although I cannot be 100% certain. I think that it is worth a trial of medication for depression. She also needs appropriate followup treatment to be confirmed before discharge.   TREATMENT PLAN: I agree with Dr. Ardine Eng decision to start her on Remeron 30 mg at night. No change to that. I would probably suggest minimizing benzodiazepines. I do not think her degree of anxiety is such that they are necessary, and they might just make her feel more tired or confused. I have put in a care management consult, suggesting that they at least discuss with the patient the possibility of  assisted living in the future. I have also put in specific orders requesting that followup appointments be made for the patient with Montel ClockKatherine Wagner and Dr. Maryruth BunKapur prior to discharge. Will follow as necessary.   DIAGNOSIS, PRINCIPAL AND PRIMARY:  AXIS I: Major depression, moderate, recurrent.   SECONDARY DIAGNOSIS:  AXIS I: No further diagnosis.  AXIS II: No diagnosis.  AXIS III: Urinary tract infection, diabetes, chronic pain, hypertension, weight loss, malaise, failure to thrive.  AXIS IV: Severe from isolation, although her daughter is doing her best as she can. She does not live locally.  AXIS V: Functioning at time of evaluation 30.   ____________________________ Audery AmelJohn T. Clapacs, MD jtc:OSi D: 01/05/2013 12:03:08 ET T: 01/05/2013 12:38:44 ET JOB#: [id number]  cc: Audery AmelJohn T. Clapacs, MD, <Dictator> Audery AmelJOHN  T CLAPACS MD ELECTRONICALLY SIGNED 01/05/2013 14:27

## 2014-08-08 NOTE — Consult Note (Signed)
Brief Consult Note: Diagnosis: major depression.   Patient was seen by consultant.   Consult note dictated.   Recommend further assessment or treatment.   Orders entered.   Comments: Psychiatry: Patient seen and note dictated. Chart reviewed. Also spoke with daughter and pharmacy. Orders done. Patient has sig depression. I agree with Dr Mordecai MaesSanchez' decision to try Remeron 30mg  qhs. Also ordered follow up be made with mental health providers. Also asked care management to discuss with her the possibility of assisited living. Will follow.  Electronic Signatures: Audery Amellapacs, Camala Talwar T (MD)  (Signed 20-Sep-14 11:52)  Authored: Brief Consult Note   Last Updated: 20-Sep-14 11:52 by Audery Amellapacs, Michaelyn Wall T (MD)

## 2014-08-08 NOTE — Op Note (Signed)
PATIENT NAME:  Ruth Nicholson, Ruth Nicholson MR#:  [id number] DATE OF BIRTH:  [birth year]  PREOPERATIVE DIAGNOSIS:  Left neck mass.  POSTOPERATIVE DIAGNOSIS:  Left neck mass.  PROCEDURE:  Fiber-optic nasal laryngoscopy.   SURGEON:  Ollen Grossaul S. Willeen CassBennett, MD  ANESTHESIA: Topical.   INDICATION: Laryngoscopy is performed to evaluate mucosal surfaces not well visualized by routine exam because of the patient's history of a large left cervical lymph node and concerns of possible metastatic malignancy.   FINDINGS: The nasal cavity and nasopharynx were free of lesions, including the fossa of Rosenmuller. The hypopharynx, larynx and tongue base were clear. There are no mucosal lesions. The vocal cords were clear and mobile. The piriform sinus was free of lesions and well- visualized.   DESCRIPTION OF PROCEDURE: After discussing the procedure with the patient, the left nasal cavity was topically anesthetized with 4% lidocaine and decongested with Afrin. A flexible fiber-optic scope was passed in the left nasal cavity, inspecting the nasal cavity, nasopharynx, hypopharynx, larynx and tongue base. The patient was instructed to phonate to assess vocal cord mobility, which was normal. The hypopharynx was insufflated, self-insufflated by the patient, to increase visualization, and they also had her extend her tongue for better visualization of the tongue base. The scope was withdrawn. She tolerated the procedure well.    ____________________________ Ollen GrossPaul S. Willeen CassBennett, MD psb:mr D: 01/06/2013 13:33:32 ET T: 01/06/2013 17:57:39 ET JOB#: [id number]  cc: Ollen GrossPaul S. Willeen CassBennett, MD, <Dictator> Sandi MealyPAUL S Elbia Paro MD ELECTRONICALLY SIGNED 01/09/2013 8:49

## 2014-08-09 NOTE — Discharge Summary (Signed)
PATIENT NAME:  Ruth Nicholson, [name] MR#:  [id number] DATE OF BIRTH:  06-19-1925  DATE OF ADMISSION:  07/07/2013 DATE OF DISCHARGE:  07/08/2013  PRIMARY CARE PHYSICIAN:  Rhona LeavensJames F. Burnett ShengHedrick, MD.    DISCHARGE DIAGNOSES:   Acute on chronic disease exacerbation of pain, constipation, hypertension, diabetes, severe malnutrition, bladder cancer.   CONDITION: Stable.   CODE STATUS: DNR.    HOME MEDICATIONS: Please refer to the medication reconciliation list.   The patient will be discharged with hospice care.   DIET: Low sodium, ADA diet.   ACTIVITY: As tolerated.   FOLLOW-UP CARE: Follow with PCP within 1 to 2 weeks   REASON FOR ADMISSION: Back pain and lower abdominal pain.  CONSULTATION:  Palliative care: Ned GraceNancy Phifer, MD.   HOSPITAL COURSE: The patient is an 81 year old Caucasian female with a history of hypertension, diabetes, chronic arthritis, bladder cancer, presented to the ED with back pain and lower abdominal pain. The patient received multiple doses of medication the ED with multiple doses of Dilaudid, but continued to have pain. The patient got a CAT scan of abdomen and pelvis which did not show any acute changes. CT lumbar spine showed some degenerative changes.  The patient's urinalysis is negative.  The patient was admitted for pain control. For detailed history and physical examination, please refer to the admission note dictated by Dr. Heron NayVasireddy.   After admission, the patient has been treated with multiple pain medications including Percocet, fentanyl patch, morphine, oxycodone. The patient continuously complained of body pain.  The patient has multiple medical problems including bladder cancer and severe malnutrition. We requested palliative care consult. Dr. Harvie JuniorPhifer discussed the patient's condition and discussed with the patient and the patient's daughter.  The patient's code status is change to DNR.     For constipation, the patient was treated with Relistor.  The patient had a  bowel movement. The patient still has back pain. The patient is clinically stable and will be discharged home with hospice care. I discussed the patient's discharge plan with the patient, the patient's daughter, nurse, case Production designer, theatre/television/filmmanager.   TIME SPENT: About 50 minutes.     ____________________________ Shaune PollackQing Quaniyah Bugh, MD qc:by D: 07/08/2013 16:43:28 ET T: 07/08/2013 20:32:44 ET JOB#: [id number]  cc: Shaune PollackQing Jewelz Ricklefs, MD, <Dictator> Shaune PollackQING Maksym Pfiffner MD ELECTRONICALLY SIGNED 07/12/2013 16:08

## 2014-08-09 NOTE — H&P (Signed)
PATIENT NAME:  Ruth Nicholson, Ruth Nicholson MR#:  [id number] DATE OF BIRTH:  05-22-1925  DATE OF ADMISSION:  07/07/2013  PRIMARY CARE PHYSICIAN:  Dr. Tera Mater.   REFERRING PHYSICIAN:  Dr. Janalyn Harder.   CHIEF COMPLAINT:  Back pain, lower abdominal pain.   HISTORY OF PRESENT ILLNESS:  Ruth Nicholson is an 81 year old female who is on very high doses of pain medications for chronic back pain with a recent diagnosis of bladder cancer; however, not receiving any treatment who comes to the Emergency Department with complaints of abdominal pain, back pain.  The patient is on OxyContin, oxycodone and fentanyl patch.  The patient states her pain does not get controlled.  Today the pain is worse than usual.  The patient was brought to the Emergency Department on Wednesday and was found to have urinary tract infection.  The patient was started on ciprofloxacin.  The patient received multiple doses of pain medications in the Emergency Department with multiple doses of Dilaudid and continues to have pain.  Work-up in the Emergency Department, CT abdomen and pelvis, chronic changes consistent with previous CT abdomen and pelvis.  CT lumbar spine, some degenerative changes consistent with the patient's age, otherwise no abnormalities.  As the patient continued to have the pain, decision is made to admit the patient to the hospital for pain control.  The patient has mild elevation of the WBC count of 12.5 thousand.  Repeat UA is negative.   PAST MEDICAL HISTORY: 1.  Diabetes mellitus.  2.  Hypertension.  3.  Chronic osteoarthritis.  4.  History of thrombophlebitis.   PAST SURGICAL HISTORY: 1.  Hysterectomy.  2.  Rotator cuff surgery.  3.  Back surgery.   ALLERGIES:  SULFA.   HOME MEDICATIONS: 1.  Zoloft 50 mg once a day. 2.  Trazodone 100 mg at bedtime. 3.   100 mg once a day.  4.  OxyContin 15 mg every 12 hours.  5.  Methotrexate 2.5 mg 6 tablets once a week.  6.  Fentanyl 50 mcg every three days.  7.  Docusate  sodium 100 mg 2 times a day.  8.  Cipro 500 mg q. 12 hours.  9.  Percocet 7.5/325 mg every eight hours.  10.  Abilify 5 mg two tablets once a day.   SOCIAL HISTORY:  No history of smoking, drinking alcohol or using illicit drugs.  Lives by herself.  Has a close follow-up by her daughter.   FAMILY HISTORY:  Father died of a heart attack at a young age.    REVIEW OF SYSTEMS:  Unable to obtain as the patient is quite somnolent.   PHYSICAL EXAMINATION:  GENERAL:  This is a thin, cachectic-looking female lying down in the bed, not in distress.  VITAL SIGNS:  Temperature 98.9, pulse 89, blood pressure 140/69, respiratory rate of 18, oxygen saturation 99% on 2 liters of oxygen.  HEENT:  Head normocephalic, atraumatic.  Eyes, no scleral icterus.  Conjunctivae normal.  Pupils equal and react to light.  Extraocular movements are intact.  Mucous membranes mild dryness.  No pharyngeal erythema.  NECK:  Supple.  No lymphadenopathy.  No JVD.  No carotid bruit.  CHEST:  Has no focal tenderness.  LUNGS:  Bilateral clear to auscultation.  Decreased breath sounds in the lower lobes.  HEART:  S1, S2 regular.  No murmurs are heard.  ABDOMEN:  Bowel sounds plus.  Soft, nontender, nondistended.  Could not appreciate hepatosplenomegaly.  EXTREMITIES:  No pedal edema.  Pulses  2+. NEUROLOGIC:  The patient is quite somnolent from the pain medications.  Could not examine the motor and sensory.  SKIN:  No rash or lesions.  MUSCULOSKELETAL:  Could not examine the musculoskeletal.  LABORATORY DATA:  CT abdomen and pelvis shows progressive intra, extrahepatic biliary ductal dilatation coupled with gallbladder distention, is concerning for distal biliary obstruction.  Recommended to obtain MRCP/ERCP for further evaluation and moderate pericardial effusion.  Left lower lobe atelectasis, effusion.  Moderate volume stool throughout the colon, suggests constipation.  Severe degenerative changes of the spine.   CT of the  lumbar spine, no acute compression fracture, abnormality identified.  Dextroscoliosis.  CBC, WBC of 12.5, hemoglobin 10.6, platelet count of 391.  CMP is completely within normal limits.   UA negative for nitrites and leukocyte esterase.   ASSESSMENT AND PLAN:  Ruth Nicholson is an 81 year old female who comes to the Emergency Department with complaints of abdominal pain and back pain.  1.  Acute on chronic exacerbation of the pain.  The patient is on multiple pain medications at the age of 81 and no obvious behavioral abnormalities are noted.  The patient has normal liver function tests even though the patient's abdomen and pelvis it shows dilatation of the intra and extraductal biliary dilatation.  As mentioned above, the patient has normal liver function tests.  Mild elevation of the white blood cell count.  However, does not have any fever.  Admit the patient under observation.  Continue with IV fluids, pain management and follow up.  2.  Diabetes mellitus.  Hold her medications as the patient is somnolent and keep the patient on sliding scale insulin.  3.  Hypertension:  Currently not on any medications.  4.  Constipation.  Keep the patient on MiraLAX which also could be contributing to her to pain.  5.  Also concerned about if the patient has any fibromyalgia.  6.  Keep the patient on deep vein thrombosis prophylaxis with Lovenox.   TIME SPENT:  50 minutes.    ____________________________ Susa GriffinsPadmaja Katarina Riebe, MD pv:ea D: 07/07/2013 01:00:28 ET T: 07/07/2013 03:13:49 ET JOB#: [id number]  cc: Susa GriffinsPadmaja Elira Colasanti, MD, <Dictator> Susa GriffinsPADMAJA Daemon Dowty MD ELECTRONICALLY SIGNED 07/23/2013 21:01

## 2015-09-01 IMAGING — CT CT ABD-PELV W/ CM
2 of 5 series · 15 of 46 positions shown, 17 images · IV contrast (isovue)
Comparison: CT ABD-PELV W/ CM dated 01/04/2013; CT STONE STUDY dated
12/26/2011

CLINICAL DATA: Pelvic pain and abdominal pain. History bladder
cancer.

EXAM:
CT ABDOMEN AND PELVIS WITH CONTRAST
TECHNIQUE: Multidetector CT imaging of the abdomen and pelvis was performed
using the standard protocol following bolus administration of
intravenous contrast.
CONTRAST:  Sentinel Isovue

[Series 2: routine abd pel with · axial · 0.67mm/px · z∈[-1097,-707]mm · 12 of 88 slices shown, 14 images]
[im 5/88  soft-tissue]
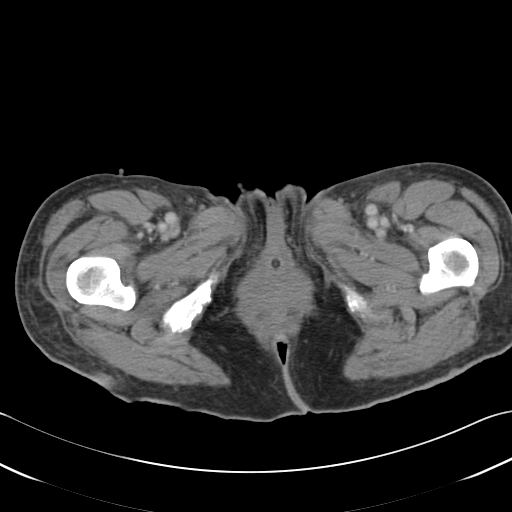
[im 5/88  bone]
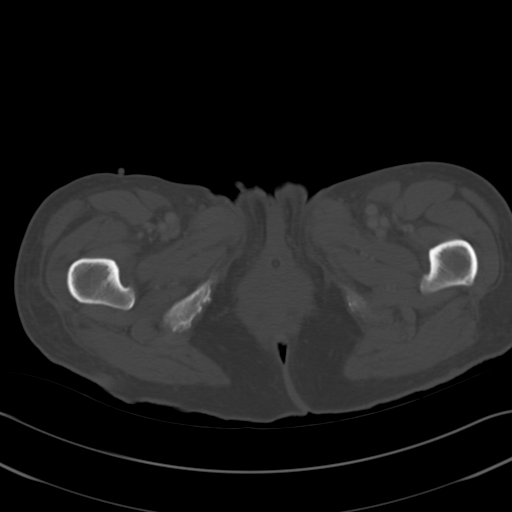
[im 14/88  soft-tissue]
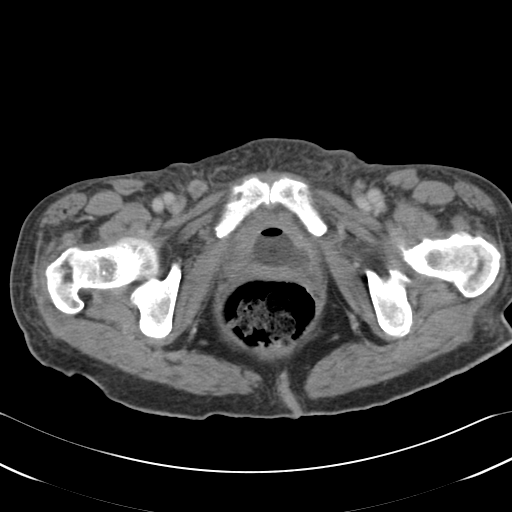
[im 19/88  soft-tissue]
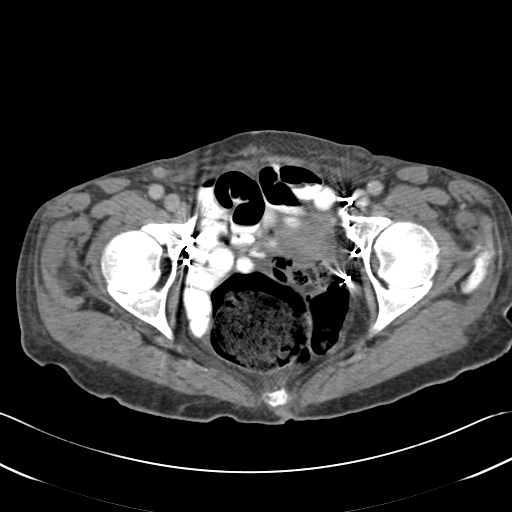
[im 28/88  soft-tissue]
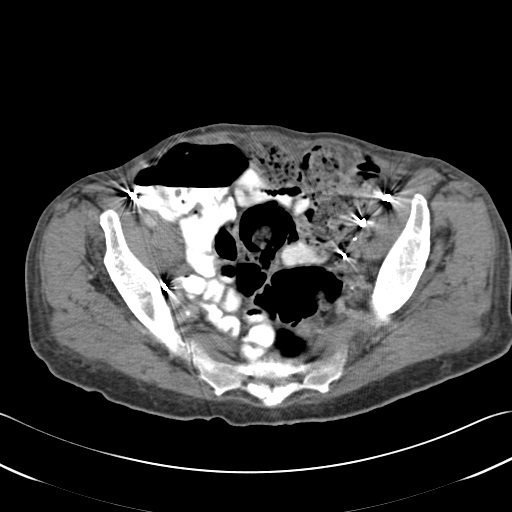
[im 33/88  soft-tissue]
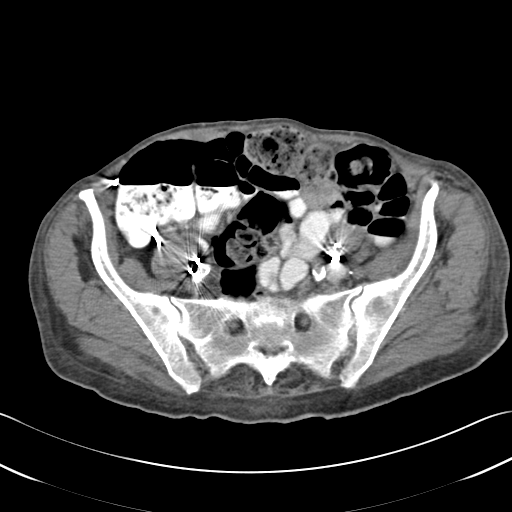
[im 42/88  soft-tissue]
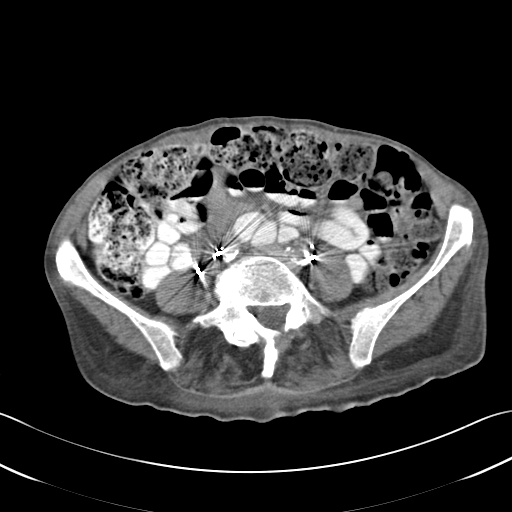
[im 46/88  soft-tissue]
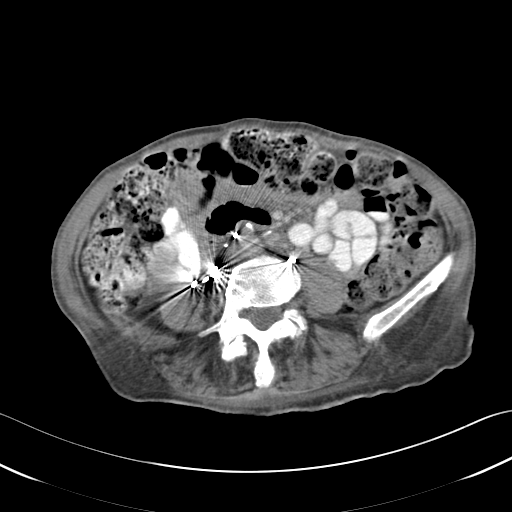
[im 55/88  soft-tissue]
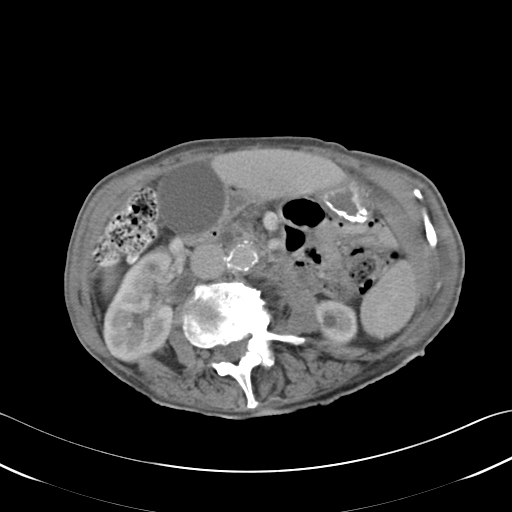
[im 60/88  soft-tissue]
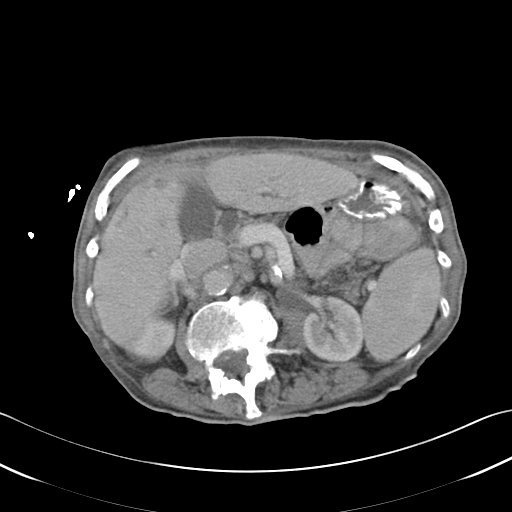
[im 60/88  bone]
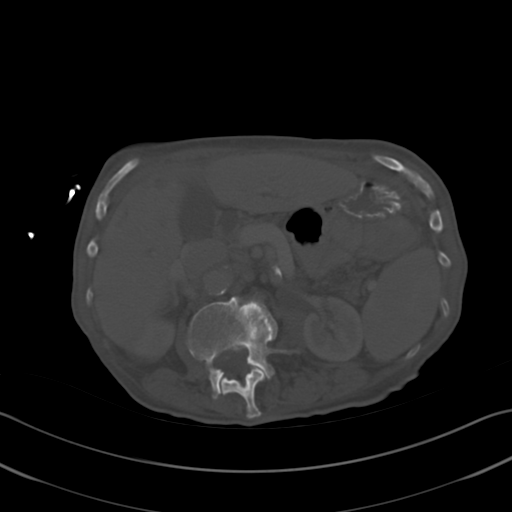
[im 69/88  soft-tissue]
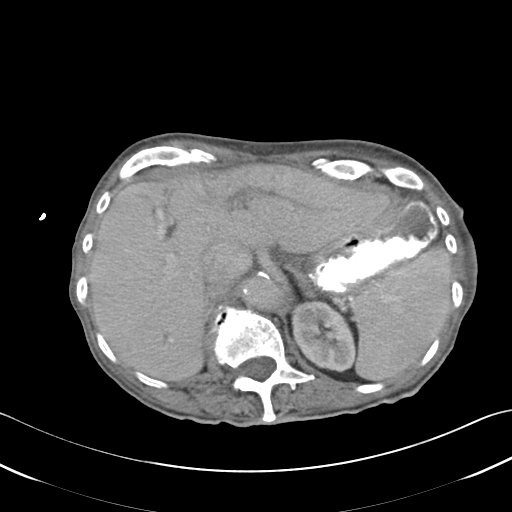
[im 74/88  soft-tissue]
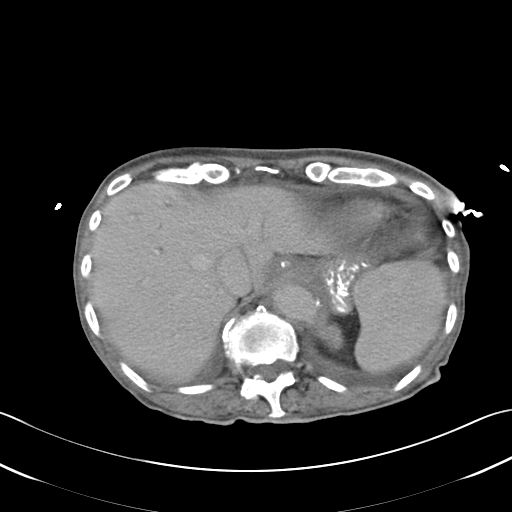
[im 83/88  soft-tissue]
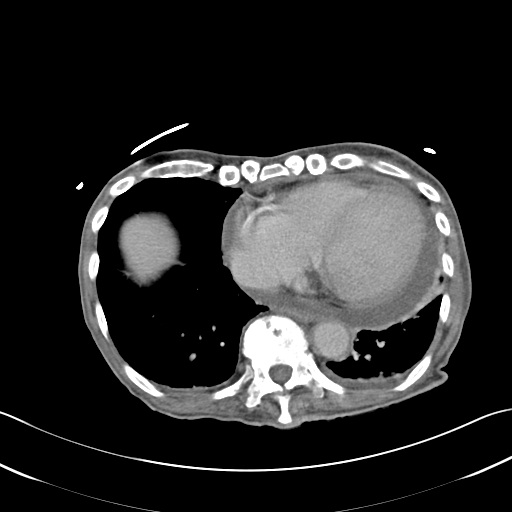

[Series 5: cor routine abd pel with · coronal · 0.66mm/px · 3 of 102 slices shown]
[im 34/102  soft-tissue]
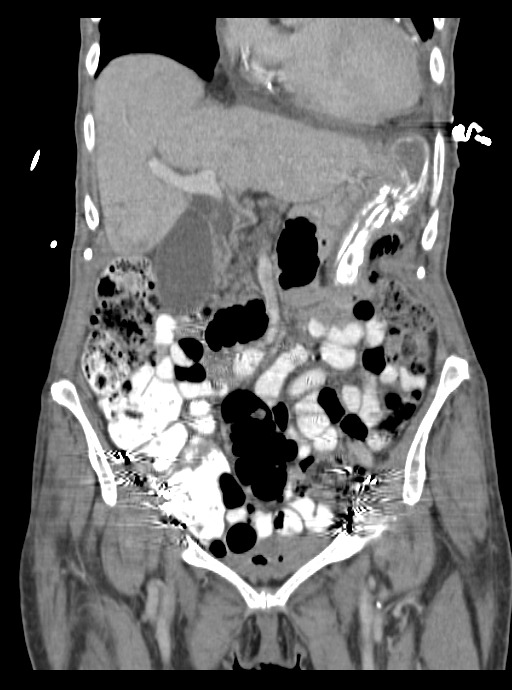
[im 45/102  soft-tissue]
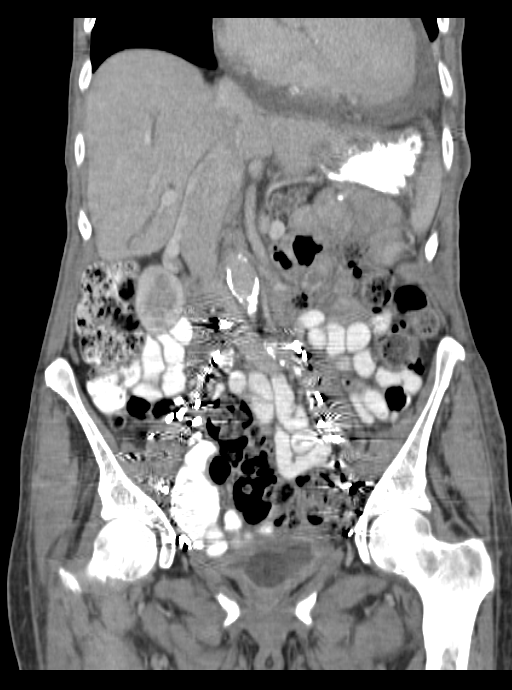
[im 57/102  soft-tissue]
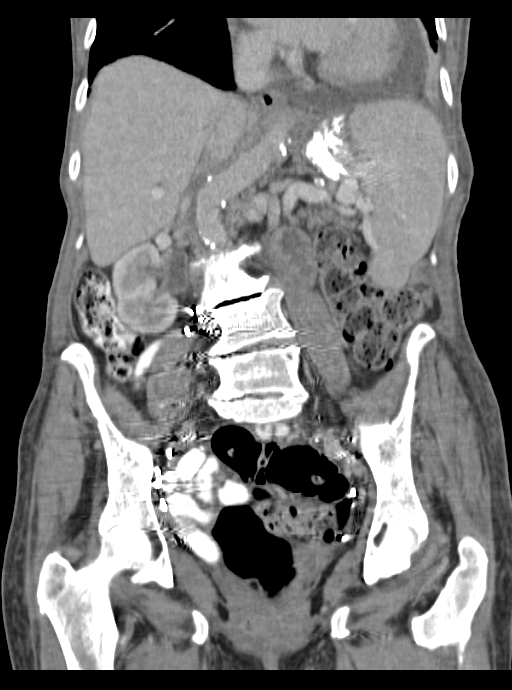

[15 of 46 positions shown; findings below may reference images not displayed]

FINDINGS: There is mild left basilar atelectasis and pleural thickening. Mild
bronchiectasis of the lung bases. The heart is enlarged. There is a
moderate pleural effusion measuring 13 mm in depth along the left
ventricle unchanged from prior.

The gallbladder is distended to 50 mm similar to comparison exam.
There is mild intrahepatic biliary duct dilatation as well as
extrahepatic biliary duct dilatation. The common bile duct measures
9 mm during the pancreatic head which is increased from 6 mm on
prior. Portal veins are patent. There is no focal hepatic lesion.
There is a nodular density dependent in the gallbladder measuring 7
mm which may represent a gallstone.

Pancreas is normal. The spleen is mildly enlarged. The adrenal
glands kidneys are normal.

Stomach, small bowel, and colon are normal. There is a large volume
stool throughout the colon.

Abdominal or is normal caliber. No retroperitoneal periportal
lymphadenopathy.

No free fluid the pelvis. There is a Foley catheter in the bladder.
No pelvic lymphadenopathy.

View of the bone windows demonstrates severe degenerative change of
the lumbar and thoracic spinal with scoliosis endplate spurring and
facet hypertrophy.
IMPRESSION: 1. Progressive intra and extrahepatic biliary duct dilatation
coupled with gallbladder distention is concerning for distal biliary
obstruction. Recommend correlation with bilirubin levels and
abnormal consider MRCP or ERCP for further evaluation / treatment.
2. Moderate pericardial effusion is similar to prior.
3. Left lower lobe atelectasis and effusion is slightly advance.
4. Moderate volume of stool throughout the colon suggest
constipation.
5. Severe degenerative change of the spine.
